# Patient Record
Sex: Male | Born: 1995
Health system: Southern US, Community
[De-identification: ages and names within clinical notes are randomized; demographics above are authoritative.]

---
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
## 2015-11-11 ENCOUNTER — Encounter: Payer: Self-pay | Admitting: Emergency Medicine

## 2015-11-11 ENCOUNTER — Emergency Department: Payer: 59

## 2015-11-11 ENCOUNTER — Emergency Department
Admission: EM | Admit: 2015-11-11 | Discharge: 2015-11-11 | Disposition: A | Discharge status: Home / Self Care | Payer: 59 | Attending: Emergency Medicine | Admitting: Emergency Medicine

## 2015-11-11 DIAGNOSIS — M25572 Pain in left ankle and joints of left foot: Secondary | ICD-10-CM | POA: Diagnosis present

## 2015-11-11 DIAGNOSIS — W500XXD Accidental hit or strike by another person, subsequent encounter: Secondary | ICD-10-CM | POA: Insufficient documentation

## 2015-11-11 DIAGNOSIS — Y998 Other external cause status: Secondary | ICD-10-CM | POA: Insufficient documentation

## 2015-11-11 DIAGNOSIS — Y929 Unspecified place or not applicable: Secondary | ICD-10-CM | POA: Insufficient documentation

## 2015-11-11 DIAGNOSIS — S82892A Other fracture of left lower leg, initial encounter for closed fracture: Secondary | ICD-10-CM | POA: Diagnosis not present

## 2015-11-11 DIAGNOSIS — Y9361 Activity, american tackle football: Secondary | ICD-10-CM | POA: Diagnosis not present

## 2015-11-11 DIAGNOSIS — F1290 Cannabis use, unspecified, uncomplicated: Secondary | ICD-10-CM | POA: Diagnosis not present

## 2015-11-11 MED ORDER — OXYCODONE-ACETAMINOPHEN 5-325 MG PO TABS
1.0000 | ORAL_TABLET | Freq: Once | ORAL | Status: AC
  Administered 2015-11-11: 1 via ORAL
  Filled 2015-11-11: qty 1

## 2015-11-11 MED ORDER — IBUPROFEN 600 MG PO TABS: 600.0000 mg | ORAL_TABLET | Freq: Four times a day (QID) | ORAL | Status: DC | PRN

## 2015-11-11 MED ORDER — IBUPROFEN 600 MG PO TABS
600.0000 mg | ORAL_TABLET | Freq: Once | ORAL | Status: AC
  Administered 2015-11-11: 600 mg via ORAL
  Filled 2015-11-11: qty 1

## 2015-11-11 MED ORDER — IBUPROFEN 50 MG PO CHEW: 50.0000 mg | CHEWABLE_TABLET | Freq: Three times a day (TID) | ORAL | Status: DC | PRN

## 2015-11-11 MED ORDER — TRAMADOL HCL 50 MG PO TABS: 50.0000 mg | ORAL_TABLET | Freq: Four times a day (QID) | ORAL | Status: DC | PRN

## 2015-11-11 NOTE — ED Notes (Signed)
See triage note   Someone fell on to left ankle while playing football  haivng increased pain to ankle at present and unable to bear full wt

## 2015-11-11 NOTE — ED Notes (Signed)
Pt reports playing football and and guy fell on his left ankle now with pain and swelling

## 2015-11-11 NOTE — ED Provider Notes (Signed)
[facility] Emergency Department Provider Note   ____________________________________________  Time seen: Approximately 5:15 PM  I have reviewed the triage vital signs and the nursing notes.   HISTORY  Chief Complaint Ankle Pain    HPI Allen White is a 20 y.o. male patient complaining of left ankle pain and edema which occurred while playing football today. Patient states someone fell on his left ankle. Patient states since the incident that he is unable to bear weight. Patient rates his pain discomfort as a 7/10. No palliative measures until arrival to triage which time I spent was applied.   History reviewed. No pertinent past medical history.  There are no active problems to display for this patient.   History reviewed. No pertinent past surgical history.  Current Outpatient Rx  Name  Route  Sig  Dispense  Refill  . ibuprofen (ADVIL,MOTRIN) 50 MG chewable tablet   Oral   Chew 1 tablet (50 mg total) by mouth every 8 (eight) hours as needed for fever.   24 tablet   2   . traMADol (ULTRAM) 50 MG tablet   Oral   Take 1 tablet (50 mg total) by mouth every 6 (six) hours as needed.   20 tablet   0     Allergies Review of patient's allergies indicates no known allergies.  No family history on file.  Social History Social History  Substance Use Topics  . Smoking status: Never Smoker   . Smokeless tobacco: Never Used  . Alcohol Use: Yes     Comment: occas    Review of Systems Constitutional: No fever/chills Eyes: No visual changes. ENT: No sore throat. Cardiovascular: Denies chest pain. Respiratory: Denies shortness of breath. Gastrointestinal: No abdominal pain.  No nausea, no vomiting.  No diarrhea.  No constipation. Genitourinary: Negative for dysuria. Musculoskeletal: Left ankle pain and edema  Skin: Negative for rash. Neurological: Negative for headaches, focal weakness or  numbness.    ____________________________________________   PHYSICAL EXAM:  VITAL SIGNS: ED Triage Vitals  Enc Vitals Group     BP 11/11/15 1640 132/73 mmHg     Pulse Rate 11/11/15 1640 69     Resp 11/11/15 1640 20     Temp 11/11/15 1640 98.3 F (36.8 C)     Temp Source 11/11/15 1640 Oral     SpO2 11/11/15 1640 98 %     Weight 11/11/15 1640 180 lb (81.647 kg)     Height 11/11/15 1640 6' (1.829 m)     Head Cir --      Peak Flow --      Pain Score 11/11/15 1642 7     Pain Loc --      Pain Edu? --      Excl. in GC? --     Constitutional: Alert and oriented. Well appearing and in no acute distress. Eyes: Conjunctivae are normal. PERRL. EOMI. Head: Atraumatic. Nose: No congestion/rhinnorhea. Mouth/Throat: Mucous membranes are moist.  Oropharynx non-erythematous. Neck: No stridor.  No cervical spine tenderness to palpation. Hematological/Lymphatic/Immunilogical: No cervical lymphadenopathy. Cardiovascular: Normal rate, regular rhythm. Grossly normal heart sounds.  Good peripheral circulation. Respiratory: Normal respiratory effort.  No retractions. Lungs CTAB. Gastrointestinal: Soft and nontender. No distention. No abdominal bruits. No CVA tenderness. Musculoskeletal: Obvious deformity to the left ankle. Obvious lateral malleolus edema.  Neurologic:  Normal speech and language. No gross focal neurologic deficits are appreciated. No gait instability. Skin:  Skin is warm, dry and intact. No rash noted. Psychiatric:  Mood and affect are normal. Speech and behavior are normal.  ____________________________________________   LABS (all labs ordered are listed, but only abnormal results are displayed)  Labs Reviewed - No data to display ____________________________________________  EKG   ____________________________________________  RADIOLOGY  X-rays reveal avulsion fracture off the lateral malleolus.   ____________________________________________   PROCEDURES  Procedure(s) performed: None  Procedures  Critical Care performed: No  ____________________________________________   INITIAL IMPRESSION / ASSESSMENT AND PLAN / ED COURSE  Pertinent labs & imaging results that were available during my care of the patient were reviewed by me and considered in my medical decision making (see chart for details).  Avulsion fracture left lateral ankle. Discussed x-ray finding with patient. Patient placed in a posterior OCL splint. Patient given crutches for ambulation. Patient given discharge Instructions. Patient given a prescription for tramadol and ibuprofen. Patient advised follow orthopedics no improvement in 3 days. ____________________________________________   FINAL CLINICAL IMPRESSION(S) / ED DIAGNOSES  Final diagnoses:  Avulsion fracture of left ankle, closed, initial encounter      NEW MEDICATIONS STARTED DURING THIS VISIT:  New Prescriptions   IBUPROFEN (ADVIL,MOTRIN) 50 MG CHEWABLE TABLET    Chew 1 tablet (50 mg total) by mouth every 8 (eight) hours as needed for fever.   TRAMADOL (ULTRAM) 50 MG TABLET    Take 1 tablet (50 mg total) by mouth every 6 (six) hours as needed.     Note:  This document was prepared using Dragon voice recognition software and may include unintentional dictation errors.    Joni Reining, PA-C 11/11/15 1727  Myrna Blazer, MD 11/11/15 [phone]

## 2015-11-11 NOTE — Discharge Instructions (Signed)
Advised to wear his splint ambulate with crutches for 3 days. Cast or Splint Care Casts and splints support injured limbs and keep bones from moving while they heal.  HOME CARE  Keep the cast or splint uncovered during the drying period.  A plaster cast can take 24 to 48 hours to dry.  A fiberglass cast will dry in less than 1 hour.  Do not rest the cast on anything harder than a pillow for 24 hours.  Do not put weight on your injured limb. Do not put pressure on the cast. Wait for your doctor's approval.  Keep the cast or splint dry.  Cover the cast or splint with a plastic bag during baths or wet weather.  If you have a cast over your chest and belly (trunk), take sponge baths until the cast is taken off.  If your cast gets wet, dry it with a towel or blow dryer. Use the cool setting on the blow dryer.  Keep your cast or splint clean. Wash a dirty cast with a damp cloth.  Do not put any objects under your cast or splint.  Do not scratch the skin under the cast with an object. If itching is a problem, use a blow dryer on a cool setting over the itchy area.  Do not trim or cut your cast.  Do not take out the padding from inside your cast.  Exercise your joints near the cast as told by your doctor.  Raise (elevate) your injured limb on 1 or 2 pillows for the first 1 to 3 days. GET HELP IF:  Your cast or splint cracks.  Your cast or splint is too tight or too loose.  You itch badly under the cast.  Your cast gets wet or has a soft spot.  You have a bad smell coming from the cast.  You get an object stuck under the cast.  Your skin around the cast becomes red or sore.  You have new or more pain after the cast is put on. GET HELP RIGHT AWAY IF:  You have fluid leaking through the cast.  You cannot move your fingers or toes.  Your fingers or toes turn blue or white or are cool, painful, or puffy (swollen).  You have tingling or lose feeling (numbness) around  the injured area.  You have bad pain or pressure under the cast.  You have trouble breathing or have shortness of breath.  You have chest pain.   This information is not intended to replace advice given to you by your health care provider. Make sure you discuss any questions you have with your health care provider.   Document Released: 08/14/2010 Document Revised: 12/15/2012 Document Reviewed: 10/21/2012 Elsevier Interactive Patient Education [employer]2016 [employer]

## 2015-12-05 LAB — HM HIV SCREENING LAB: HM HIV Screening: NEGATIVE

## 2017-02-03 IMAGING — DX DG ANKLE COMPLETE 3+V*L*
3 series · 3 of 3 positions shown · non-contrast
Comparison: None.

CLINICAL DATA: 20-year-old male with acute left ankle injury pain
and swelling following football injury today. Initial encounter.

EXAM:
LEFT ANKLE COMPLETE - 3+ VIEW

[ankle ap]
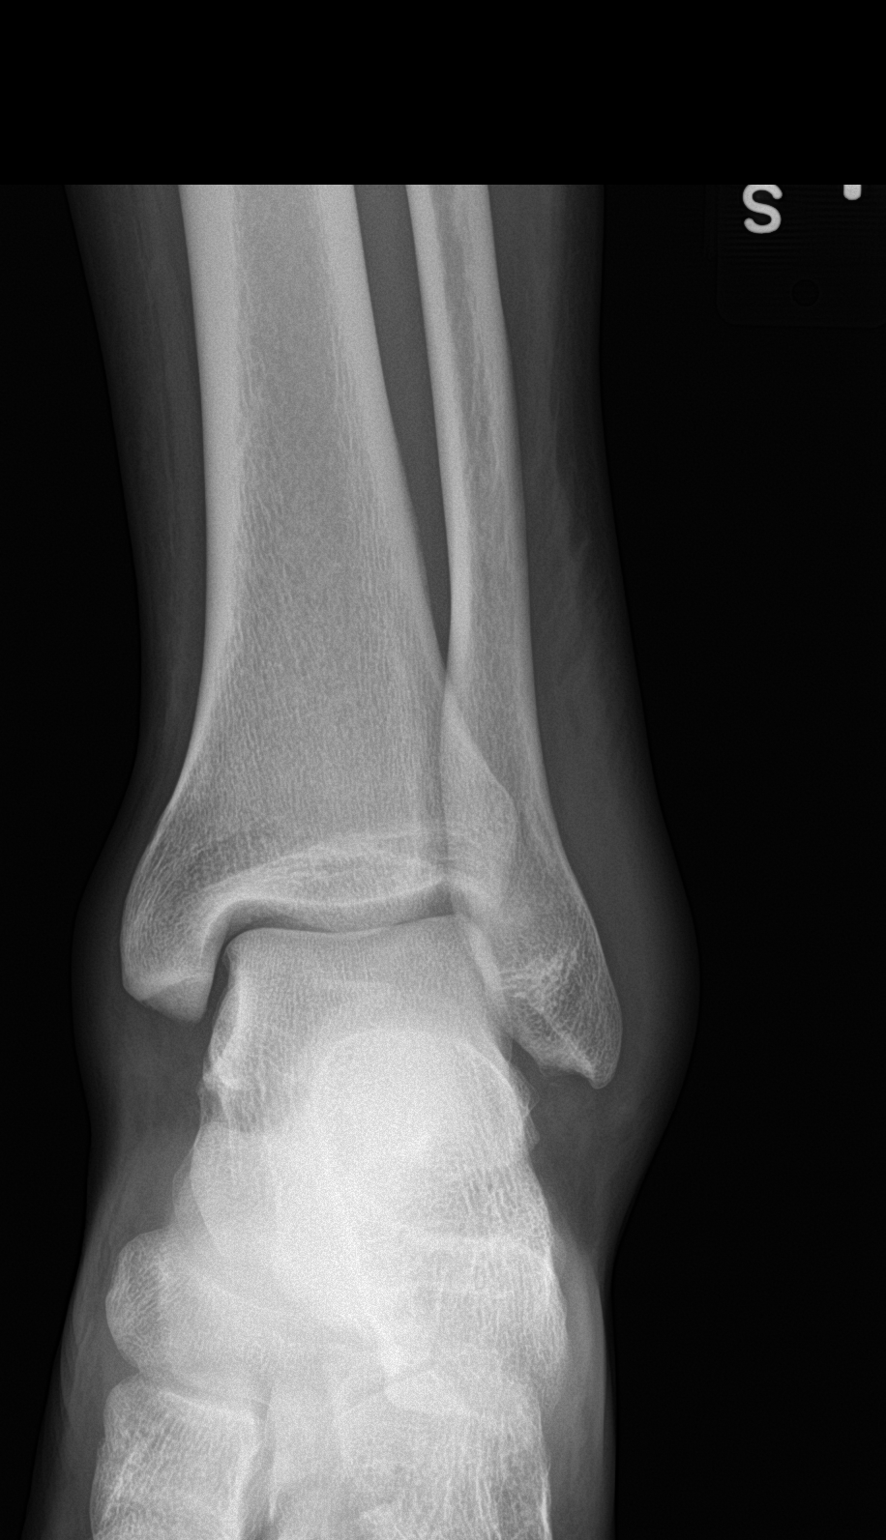

[ankle obl]
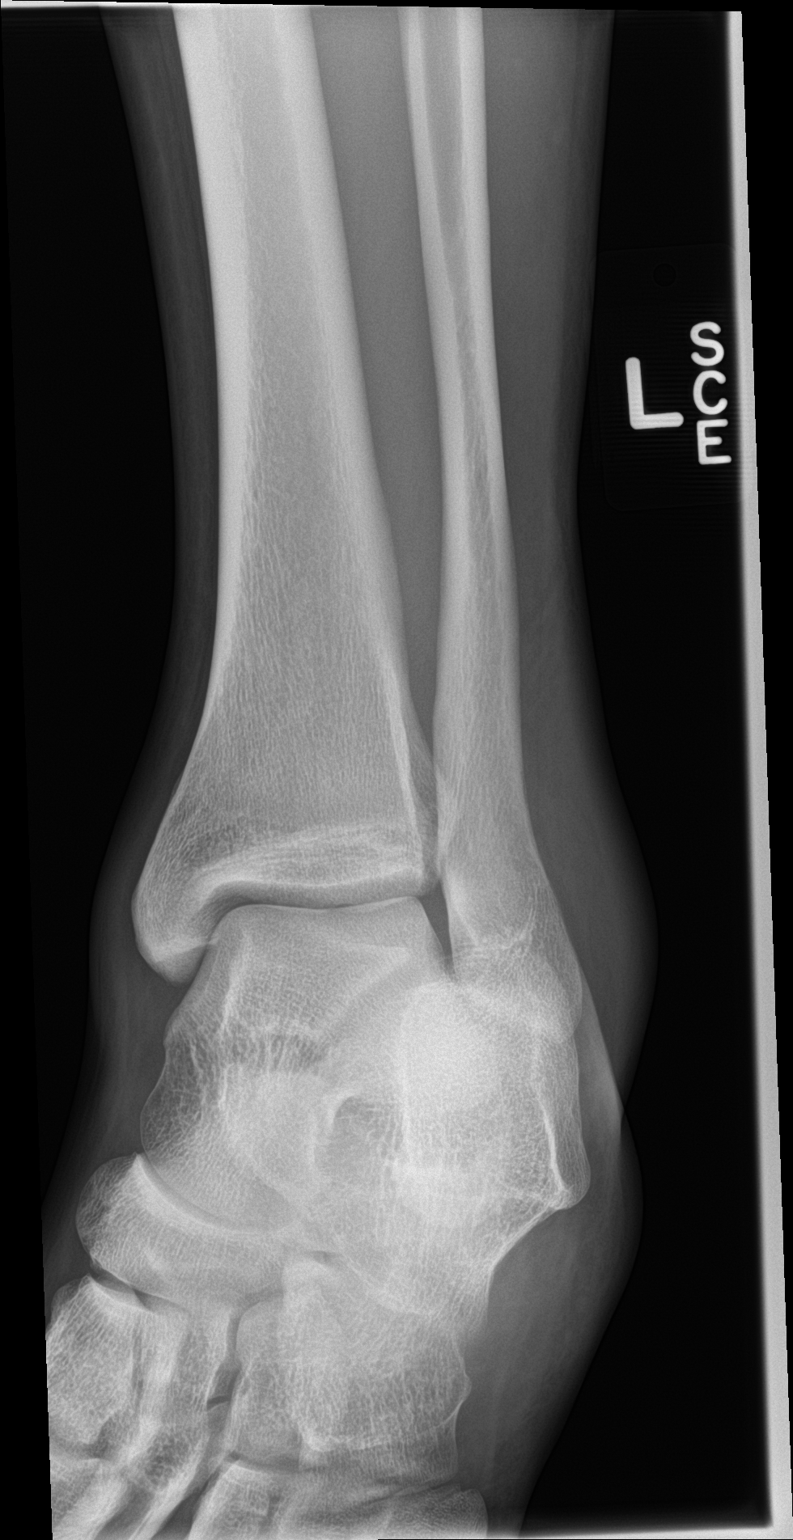

[ankle lat]
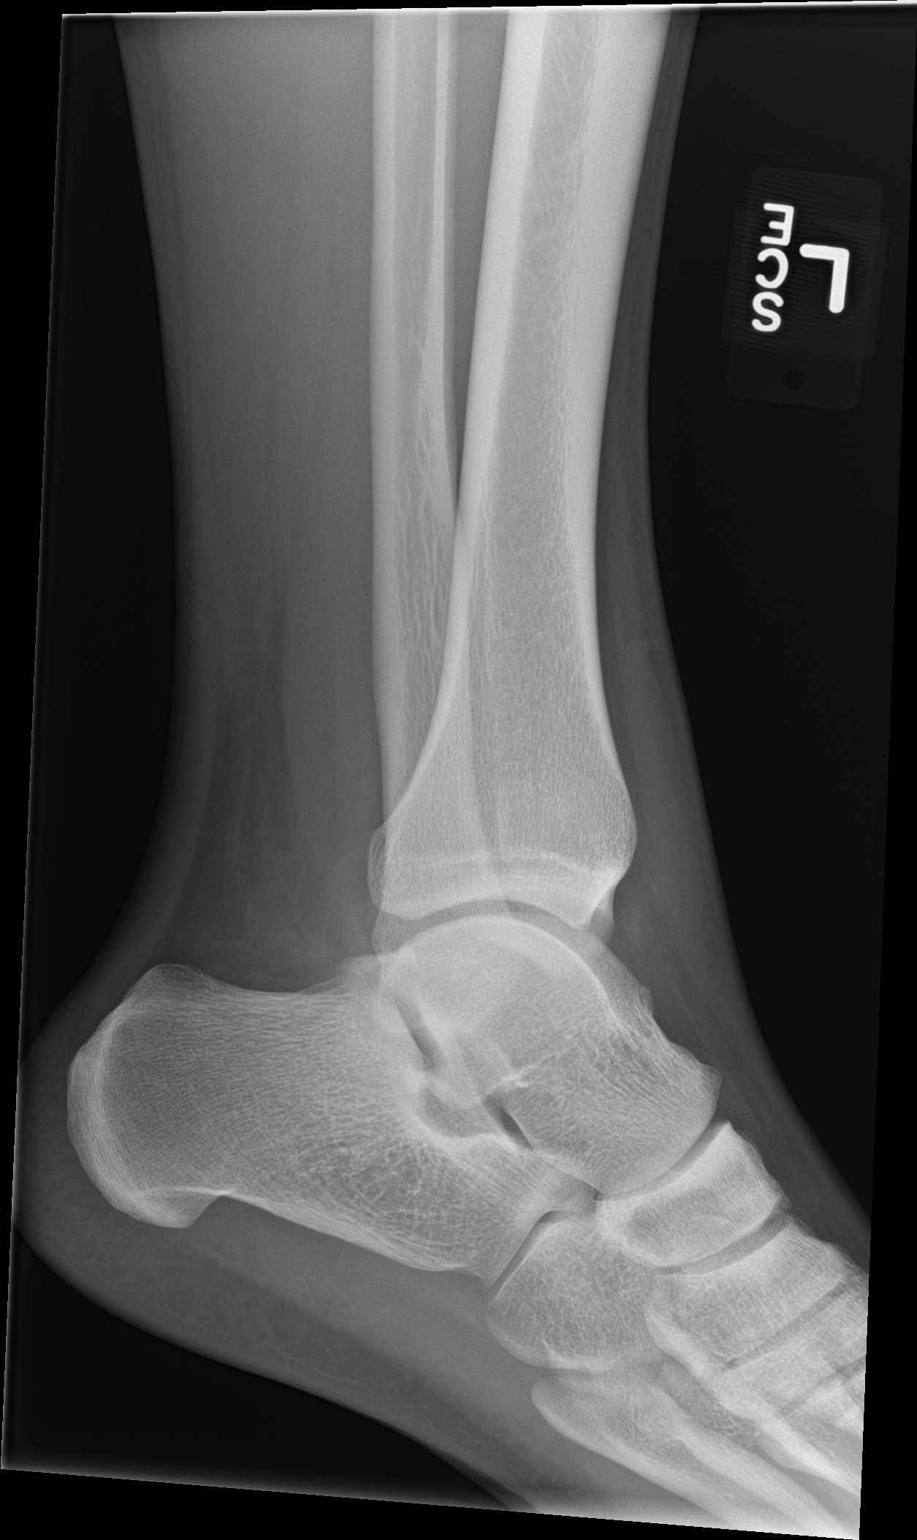

[3 of 3 positions shown; findings below may reference images not displayed]

FINDINGS: An equivocal tiny avulsion fracture off of the distal fibula noted.

Lateral soft tissue swelling is noted.

No other fracture, subluxation or dislocation identified.
IMPRESSION: Equivocal tiny avulsion fracture off the distal fibula. Lateral soft
tissue swelling.

## 2018-03-28 ENCOUNTER — Emergency Department (HOSPITAL_COMMUNITY)
Admission: EM | Admit: 2018-03-28 | Discharge: 2018-03-28 | Disposition: A | Discharge status: Home / Self Care | Payer: 59 | Attending: Emergency Medicine | Admitting: Emergency Medicine

## 2018-03-28 ENCOUNTER — Encounter (HOSPITAL_COMMUNITY): Payer: Self-pay | Admitting: Emergency Medicine

## 2018-03-28 ENCOUNTER — Other Ambulatory Visit: Payer: Self-pay

## 2018-03-28 DIAGNOSIS — F10920 Alcohol use, unspecified with intoxication, uncomplicated: Secondary | ICD-10-CM | POA: Insufficient documentation

## 2018-03-28 LAB — CBG MONITORING, ED: Glucose-Capillary: 101 mg/dL — ABNORMAL HIGH (ref 70–99)

## 2018-03-28 NOTE — ED Notes (Signed)
Patient had vomit all over shirt and upper body. Patient was cleaned up and tried to contact family and no answer.

## 2018-03-28 NOTE — ED Notes (Signed)
[facility] Expected date:  Expected time:  Means of arrival:  Comments: EMS [age]'s male intoxicated

## 2018-03-28 NOTE — ED Notes (Signed)
RN Elita Booneash makes multiple attempts to interact with pt and perform assessment. Pt after asking basic questions such as name is choosing not to speak with staff.

## 2018-03-28 NOTE — ED Provider Notes (Signed)
TIME SEEN: 2:14 AM  CHIEF COMPLAINT: Alcohol intoxication  HPI: Patient is a 22 year old male with no significant past medical history who presents to the emergency department with EMS for alcohol intoxication.  Patient refusing to speak to staff.  ROS: Level 5 caveat for intoxication  PAST MEDICAL HISTORY/PAST SURGICAL HISTORY:  History reviewed. No pertinent past medical history.  MEDICATIONS:  Prior to Admission medications   Medication Sig Start Date End Date Taking? Authorizing Provider  ibuprofen (ADVIL,MOTRIN) 600 MG tablet Take 1 tablet (600 mg total) by mouth every 6 (six) hours as needed. 11/11/15   Joni ReiningSmith, Ronald K, PA-C  traMADol (ULTRAM) 50 MG tablet Take 1 tablet (50 mg total) by mouth every 6 (six) hours as needed for moderate pain. 11/11/15   Joni ReiningSmith, Ronald K, PA-C    ALLERGIES:  No Known Allergies  SOCIAL HISTORY:  Social History   Tobacco Use  . Smoking status: Never Smoker  . Smokeless tobacco: Never Used  Substance Use Topics  . Alcohol use: Yes    Comment: occas    FAMILY HISTORY: History reviewed. No pertinent family history.  EXAM: BP 122/67 (BP Location: Right Arm)   Pulse 74   Temp (!) 97.4 F (36.3 C) (Axillary)   SpO2 98%  CONSTITUTIONAL: Patient sleeping.  Refuses to talk to staff.  Appears intoxicated and smells strongly of alcohol. HEAD: Normocephalic EYES: Conjunctivae clear, pupils appear equal, EOMI ENT: normal nose; moist mucous membranes NECK: Supple, no meningismus, no nuchal rigidity, no LAD  CARD: RRR; S1 and S2 appreciated; no murmurs, no clicks, no rubs, no gallops RESP: Normal chest excursion without splinting or tachypnea; breath sounds clear and equal bilaterally; no wheezes, no rhonchi, no rales, no hypoxia or respiratory distress, speaking full sentences ABD/GI: Normal bowel sounds; non-distended; soft, non-tender, no rebound, no guarding, no peritoneal signs, no hepatosplenomegaly BACK:  The back appears normal and is  non-tender to palpation, there is no CVA tenderness EXT: Normal ROM in all joints; non-tender to palpation; no edema; normal capillary refill; no cyanosis, no calf tenderness or swelling    SKIN: Normal color for age and race; warm; no rash NEURO: Moves all extremities equally PSYCH: The patient's mood and manner are appropriate. Grooming and personal hygiene are appropriate.  MEDICAL DECISION MAKING: Patient here with alcohol intoxication.  Hemodynamically stable.  Will check blood sugar and continue to monitor until clinically sober when he can be reassessed.  No signs of trauma on examination.  ED PROGRESS: Patient now awake.  No complaints of pain.  Normal blood sugar.  Able to ambulate.  Significant other and mother at bedside to take him home.  They state that he was intoxicated at a bar and his friends called EMS.  Family will stay with him at home.   At this time, I do not feel there is any life-threatening condition present. I have reviewed and discussed all results (EKG, imaging, lab, urine as appropriate) and exam findings with patient/family. I have reviewed nursing notes and appropriate previous records.  I feel the patient is safe to be discharged home without further emergent workup and can continue workup as an outpatient as needed. Discussed usual and customary return precautions. Patient/family verbalize understanding and are comfortable with this plan.  Outpatient follow-up has been provided as needed. All questions have been answered.      , Layla MawKristen N, DO 03/28/18 [phone]

## 2018-03-28 NOTE — ED Notes (Signed)
Mom called patients phone and nurse spoke to her. The only info given was that patient was here and doing ok. Patient mom is on her way.

## 2018-03-28 NOTE — ED Triage Notes (Signed)
Patient was at a bar with friend. Patient was left by friends at bar passed out. Patient vomited when EMS picked him up. Patient was given an IV and 4mg  of zofran. EMS does not know how much he drank.

## 2019-11-10 ENCOUNTER — Other Ambulatory Visit: Payer: Self-pay

## 2019-11-10 ENCOUNTER — Ambulatory Visit
Admission: EM | Admit: 2019-11-10 | Discharge: 2019-11-10 | Disposition: A | Discharge status: Home / Self Care | Payer: BC Managed Care – PPO

## 2019-11-10 DIAGNOSIS — R030 Elevated blood-pressure reading, without diagnosis of hypertension: Secondary | ICD-10-CM | POA: Diagnosis not present

## 2019-11-10 NOTE — ED Provider Notes (Signed)
[facility]    CSN: [id number] Arrival date & time: 11/10/19  1313      History   Chief Complaint Chief Complaint  Patient presents with  . Hypertension    HPI Allen White is a 24 y.o. male.   24 year old male comes in for elevated BP readings. States has been going to the dentist the past few months, with high readings, unsure number. Denies chest pain, shortness of breath, one sided weakness. Has noticed intermittent hand swelling without obvious cause. Denies current swelling. Unsure of family history of HTN.      History reviewed. No pertinent past medical history.  There are no problems to display for this patient.   History reviewed. No pertinent surgical history.     Home Medications    Prior to Admission medications   Not on File    Family History History reviewed. No pertinent family history.  Social History Social History   Tobacco Use  . Smoking status: Never Smoker  . Smokeless tobacco: Never Used  Substance Use Topics  . Alcohol use: Yes    Comment: occas  . Drug use: Yes    Types: Marijuana     Allergies   Patient has no known allergies.   Review of Systems Review of Systems  Reason unable to perform ROS: See HPI as above.     Physical Exam Triage Vital Signs ED Triage Vitals [11/10/19 1320]  Enc Vitals Group     BP (!) 142/76     Pulse Rate 65     Resp 18     Temp 98.2 F (36.8 C)     Temp src      SpO2 98 %     Weight      Height      Head Circumference      Peak Flow      Pain Score 0     Pain Loc      Pain Edu?      Excl. in GC?    No data found.  Updated Vital Signs BP (!) 142/76   Pulse 65   Temp 98.2 F (36.8 C)   Resp 18   SpO2 98%   Physical Exam Constitutional:      General: He is not in acute distress.    Appearance: Normal appearance. He is well-developed. He is not toxic-appearing or diaphoretic.  HENT:     Head: Normocephalic and atraumatic.  Eyes:     Conjunctiva/sclera:  Conjunctivae normal.     Pupils: Pupils are equal, round, and reactive to light.  Cardiovascular:     Rate and Rhythm: Normal rate and regular rhythm.  Pulmonary:     Effort: Pulmonary effort is normal. No respiratory distress.     Comments: LCTAB Musculoskeletal:     Cervical back: Normal range of motion and neck supple.  Skin:    General: Skin is warm and dry.  Neurological:     Mental Status: He is alert and oriented to person, place, and time.    UC Treatments / Results  Labs (all labs ordered are listed, but only abnormal results are displayed) Labs Reviewed - No data to display  EKG   Radiology No results found.  Procedures Procedures (including critical care time)  Medications Ordered in UC Medications - No data to display  Initial Impression / Assessment and Plan / UC Course  I have reviewed the triage vital signs and the nursing notes.  Pertinent  labs & imaging results that were available during my care of the patient were reviewed by me and considered in my medical decision making (see chart for details).    Discussed diet changes. Push fluids. Monitor BP at home and keep log. Follow up with PCP as scheduled for further evaluation and monitoring. Return precautions given.  Final Clinical Impressions(s) / UC Diagnoses   Final diagnoses:  Elevated blood pressure reading    ED Prescriptions    None     PDMP not reviewed this encounter.   Belinda Fisher, PA-C 11/10/19 1343

## 2019-11-10 NOTE — ED Triage Notes (Signed)
Pt c/o high BP reading at the dentist 3+ months ago. Pt c/o intermittent hand swelling x 3 months, hands normal at this time per patient.

## 2019-11-10 NOTE — Discharge Instructions (Signed)
Decrease salt in your diet, increase water intake. Cut out caffeine, alcohol as much as possible. Keep log of blood pressure readings at home. Follow up with PCP as scheduled for monitoring and evaluation.

## 2019-11-14 ENCOUNTER — Ambulatory Visit: Payer: Self-pay

## 2019-11-18 ENCOUNTER — Ambulatory Visit: Payer: Self-pay | Admitting: Physician Assistant

## 2019-11-18 ENCOUNTER — Other Ambulatory Visit: Payer: Self-pay

## 2019-11-18 DIAGNOSIS — Z202 Contact with and (suspected) exposure to infections with a predominantly sexual mode of transmission: Secondary | ICD-10-CM

## 2019-11-18 DIAGNOSIS — Z113 Encounter for screening for infections with a predominantly sexual mode of transmission: Secondary | ICD-10-CM

## 2019-11-18 MED ORDER — AZITHROMYCIN 500 MG PO TABS: 1000.0000 mg | ORAL_TABLET | Freq: Once | ORAL | Status: DC

## 2019-11-18 NOTE — Progress Notes (Addendum)
Gram stain reviewed, no treatment indicated. Treatment offered for NGU per provider orders, but patient declined treatment due to negative gram stain. He states that since his test was negative he doesn't want to take medication.Burt Knack, RN

## 2019-11-19 ENCOUNTER — Encounter: Payer: Self-pay | Admitting: Physician Assistant

## 2019-11-19 LAB — GRAM STAIN

## 2019-11-19 NOTE — Progress Notes (Signed)
[facility] Department STI clinic/screening visit  Subjective:  Allen White is a 24 y.o. male being seen today for an STI screening visit. The patient reports they do not have symptoms.    Patient has the following medical conditions:  There are no problems to display for this patient.    Chief Complaint  Patient presents with  . SEXUALLY TRANSMITTED DISEASE    screening    HPI  Patient reports that he is not having any symptoms but a former partner told him that she tested positive for Chlamydia.  Denies chronic conditions, surgeries and regular medicines.  States last HIV testing was 1.5 years ago.  Last void prior to sample collection for Gram stain was 2 hr ago.   See flowsheet for further details and programmatic requirements.    The following portions of the patient's history were reviewed and updated as appropriate: allergies, current medications, past medical history, past social history, past surgical history and problem list.  Objective:  There were no vitals filed for this visit.  Physical Exam Constitutional:      General: He is not in acute distress.    Appearance: Normal appearance.  HENT:     Head: Normocephalic and atraumatic.     Comments: No nits, lice or hair loss. No cervical, supraclavicular or axillary adenopathy.    Mouth/Throat:     Mouth: Mucous membranes are moist.     Pharynx: Oropharynx is clear. No oropharyngeal exudate or posterior oropharyngeal erythema.  Eyes:     Conjunctiva/sclera: Conjunctivae normal.  Pulmonary:     Effort: Pulmonary effort is normal.  Abdominal:     Palpations: Abdomen is soft. There is no mass.     Tenderness: There is no abdominal tenderness. There is no guarding or rebound.  Genitourinary:    Penis: Normal.      Testes: Normal.     Comments: Pubic area without nits, lice, edema, erythema, lesions and inguinal adenopathy. Penis circumcised, without rash, lesions and discharge form  meatus. Musculoskeletal:     Cervical back: Neck supple. No tenderness.  Skin:    General: Skin is warm and dry.     Findings: No bruising, erythema, lesion or rash.  Neurological:     Mental Status: He is alert and oriented to person, place, and time.  Psychiatric:        Mood and Affect: Mood normal.        Behavior: Behavior normal.        Thought Content: Thought content normal.        Judgment: Judgment normal.       Assessment and Plan:  Allen White is a 24 y.o. male presenting to the [facility] Department for STI screening  1. Screening for STD (sexually transmitted disease) Patient into clinic without symptoms. Patient declines blood work today.  Rec condoms with all sex. Await test results.  Counseled that RN will call if needs to RTC for treatment once results are back. - Gram stain - Gonococcus culture  2. Chlamydia contact Counseled patient that since a partner tested positive for an infection, Chlamydia,we recommend that he be treated. Counseled that the Gram stain is not a specific test for Chlamydia and even if negative he could still have the Chlamydia infection. Offer Azithromycin 1 g po DOT to treat as a contact to Chlamydia today. Rec that patient's new partner also be screened.     No follow-ups on file.  Future Appointments  Date Time Provider Department Center  01/13/2020  9:30 AM Mayer Masker, PA-C [facility] None    Matt Holmes, [place]

## 2019-11-23 LAB — GONOCOCCUS CULTURE

## 2019-12-23 ENCOUNTER — Other Ambulatory Visit: Payer: Self-pay | Admitting: Sleep Medicine

## 2019-12-23 ENCOUNTER — Other Ambulatory Visit: Payer: BC Managed Care – PPO

## 2019-12-23 DIAGNOSIS — Z20822 Contact with and (suspected) exposure to covid-19: Secondary | ICD-10-CM | POA: Diagnosis not present

## 2019-12-25 LAB — SARS-COV-2, NAA 2 DAY TAT

## 2019-12-25 LAB — NOVEL CORONAVIRUS, NAA: SARS-CoV-2, NAA: NOT DETECTED

## 2019-12-30 ENCOUNTER — Other Ambulatory Visit: Payer: Self-pay

## 2019-12-30 ENCOUNTER — Ambulatory Visit
Admission: EM | Admit: 2019-12-30 | Discharge: 2019-12-30 | Disposition: A | Discharge status: Home / Self Care | Payer: BC Managed Care – PPO | Attending: Physician Assistant | Admitting: Physician Assistant

## 2019-12-30 DIAGNOSIS — Z1152 Encounter for screening for COVID-19: Secondary | ICD-10-CM | POA: Diagnosis not present

## 2019-12-30 DIAGNOSIS — R059 Cough, unspecified: Secondary | ICD-10-CM

## 2019-12-30 DIAGNOSIS — R509 Fever, unspecified: Secondary | ICD-10-CM

## 2019-12-30 DIAGNOSIS — R0981 Nasal congestion: Secondary | ICD-10-CM

## 2019-12-30 MED ORDER — BENZONATATE 200 MG PO CAPS: 200.0000 mg | ORAL_CAPSULE | Freq: Three times a day (TID) | ORAL | 0 refills | Status: DC

## 2019-12-30 NOTE — Discharge Instructions (Addendum)
COVID PCR testing ordered. I would like you to quarantine until testing results. Tessalon for cough. Start nasonex nasal spray for nasal congestion/drainage. You can use over the counter nasal saline rinse such as neti pot for nasal congestion. Keep hydrated, your urine should be clear to pale yellow in color. Tylenol/motrin for fever and pain. Keep hydrated, urine should be clear to pale yellow in color. If experiencing shortness of breath, trouble breathing, go to the emergency department for further evaluation needed.

## 2019-12-30 NOTE — ED Triage Notes (Signed)
Pt c/o malaise, HA, productive cough with clear/green sputum, congestion, runny nose, lack of appetite, body aches, decreased sense of taste/smell since Tuesday. Reports Tmax of 101.5 last night which improved with ibuprofen. States his mom had similar symptoms and was negative for COVID. Denies sore throat, v/d, SOB.

## 2019-12-30 NOTE — ED Provider Notes (Signed)
[facility]    CSN: [id number] Arrival date & time: 12/30/19  0847      History   Chief Complaint Chief Complaint  Patient presents with   Fever   Fatigue    HPI Allen White is a 24 y.o. male.   24 year old male comes in for 3 day of URI symptoms. Cough, headache, nasal congestion, rhinorrhea, sinus pressure, lack of appetite, body aches, decreased sense of taste/smell. Had a temp of 101.5 last night, improved with antipyretic, no antipyretic in last 8 hours. Denies abdominal pain, nausea, vomiting, diarrhea. Denies shortness of breath. Current every day smoker, 2 cigars. Positive sick contact.     History reviewed. No pertinent past medical history.  There are no problems to display for this patient.   History reviewed. No pertinent surgical history.     Home Medications    Prior to Admission medications   Medication Sig Start Date End Date Taking? Authorizing Provider  benzonatate (TESSALON) 200 MG capsule Take 1 capsule (200 mg total) by mouth every 8 (eight) hours. 12/30/19   Belinda Fisher, PA-C    Family History Family History  Problem Relation Age of Onset   Healthy Mother    Healthy Father     Social History Social History   Tobacco Use   Smoking status: Current Some Day Smoker    Types: Cigars   Smokeless tobacco: Never Used  Building services engineer Use: Never used  Substance Use Topics   Alcohol use: Yes    Comment: occas   Drug use: Yes    Types: Marijuana     Allergies   Patient has no known allergies.   Review of Systems Review of Systems  Reason unable to perform ROS: See HPI as above.     Physical Exam Triage Vital Signs ED Triage Vitals  Enc Vitals Group     BP 12/30/19 1018 124/87     Pulse Rate 12/30/19 1018 85     Resp 12/30/19 1018 16     Temp 12/30/19 1018 98.4 F (36.9 C)     Temp Source 12/30/19 1018 Oral     SpO2 12/30/19 1018 97 %     Weight --      Height --      Head Circumference --       Peak Flow --      Pain Score 12/30/19 1028 0     Pain Loc --      Pain Edu? --      Excl. in GC? --    No data found.  Updated Vital Signs BP 124/87 (BP Location: Left Arm)    Pulse 85    Temp 98.4 F (36.9 C) (Oral)    Resp 16    SpO2 97%   Physical Exam Constitutional:      General: He is not in acute distress.    Appearance: He is well-developed. He is not ill-appearing, toxic-appearing or diaphoretic.  HENT:     Head: Normocephalic and atraumatic.     Left Ear: Tympanic membrane, ear canal and external ear normal. Tympanic membrane is not erythematous or bulging.     Ears:     Comments: Right ear cerumen impaction.     Nose:     Right Sinus: No maxillary sinus tenderness or frontal sinus tenderness.     Left Sinus: No maxillary sinus tenderness or frontal sinus tenderness.     Mouth/Throat:  Mouth: Mucous membranes are moist.     Pharynx: Oropharynx is clear. Uvula midline.  Eyes:     Conjunctiva/sclera: Conjunctivae normal.     Pupils: Pupils are equal, round, and reactive to light.  Cardiovascular:     Rate and Rhythm: Normal rate and regular rhythm.  Pulmonary:     Effort: Pulmonary effort is normal. No accessory muscle usage, prolonged expiration, respiratory distress or retractions.     Breath sounds: No decreased air movement or transmitted upper airway sounds. No decreased breath sounds.     Comments: LCTAB Musculoskeletal:     Cervical back: Normal range of motion and neck supple.  Skin:    General: Skin is warm and dry.  Neurological:     Mental Status: He is alert and oriented to person, place, and time.      UC Treatments / Results  Labs (all labs ordered are listed, but only abnormal results are displayed) Labs Reviewed  NOVEL CORONAVIRUS, NAA    EKG   Radiology No results found.  Procedures Procedures (including critical care time)  Medications Ordered in UC Medications - No data to display  Initial Impression / Assessment and Plan  / UC Course  I have reviewed the triage vital signs and the nursing notes.  Pertinent labs & imaging results that were available during my care of the patient were reviewed by me and considered in my medical decision making (see chart for details).    COVID PCR test ordered. Patient to quarantine until testing results return. No alarming signs on exam. LCTAB. Symptomatic treatment discussed.  Push fluids.  Return precautions given.  Patient expresses understanding and agrees to plan.  Final Clinical Impressions(s) / UC Diagnoses   Final diagnoses:  Encounter for screening for COVID-19  Fever, unspecified  Cough  Nasal congestion    ED Prescriptions    Medication Sig Dispense Auth. Provider   benzonatate (TESSALON) 200 MG capsule Take 1 capsule (200 mg total) by mouth every 8 (eight) hours. 21 capsule Belinda Fisher, PA-C     PDMP not reviewed this encounter.   Belinda Fisher, PA-C 12/30/19 1051

## 2019-12-31 LAB — NOVEL CORONAVIRUS, NAA: SARS-CoV-2, NAA: DETECTED — AB

## 2020-01-02 ENCOUNTER — Telehealth: Payer: Self-pay | Admitting: Nurse Practitioner

## 2020-01-02 NOTE — Telephone Encounter (Signed)
Called to discuss with Allen White about Covid symptoms and the use of casirivimab/imdevimab, a combination monoclonal antibody infusion for those with mild to moderate Covid symptoms and at a high risk of hospitalization.     Pt is qualified for this infusion at the [facility] infusion center due to co-morbid conditions and/or a member of an at-risk group. Unable to reach. Voicemail left and Mychart message sent.   Willette Alma, AGPCNP-BC

## 2020-01-13 ENCOUNTER — Ambulatory Visit: Payer: BC Managed Care – PPO | Admitting: Physician Assistant

## 2020-01-24 ENCOUNTER — Encounter: Payer: Self-pay | Admitting: Physician Assistant

## 2020-01-24 ENCOUNTER — Other Ambulatory Visit: Payer: Self-pay

## 2020-01-24 ENCOUNTER — Ambulatory Visit (INDEPENDENT_AMBULATORY_CARE_PROVIDER_SITE_OTHER): Payer: BC Managed Care – PPO | Admitting: Physician Assistant

## 2020-01-24 VITALS — BP 122/83 | HR 71 | Ht 70.87 in | Wt 205.2 lb

## 2020-01-24 DIAGNOSIS — Z7689 Persons encountering health services in other specified circumstances: Secondary | ICD-10-CM

## 2020-01-24 DIAGNOSIS — G4730 Sleep apnea, unspecified: Secondary | ICD-10-CM | POA: Diagnosis not present

## 2020-01-24 DIAGNOSIS — Z8249 Family history of ischemic heart disease and other diseases of the circulatory system: Secondary | ICD-10-CM

## 2020-01-24 DIAGNOSIS — F515 Nightmare disorder: Secondary | ICD-10-CM

## 2020-01-24 DIAGNOSIS — Z8616 Personal history of COVID-19: Secondary | ICD-10-CM

## 2020-01-24 NOTE — Patient Instructions (Signed)

## 2020-01-24 NOTE — Progress Notes (Signed)
New Patient Office Visit  Subjective:  Patient ID: Allen White, male    DOB: November 21, 1995  Age: 24 y.o. MRN: [id number]  CC:  Chief Complaint  Patient presents with  . New Patient (Initial Visit)    HPI Allen White presents to establish as a new patient with concerns of elevated blood pressure and breathing post Covid infection. Patient reports he continues to experience some shortness of breath with exertion. His mom has an albuterol inhaler which he has used in a few instances for severe shortness of breath. Also reports random palpitations that last briefly which started after getting sick with Covid. Reports a family history of hypertension and there have been some instances where his blood pressure has been elevated. He does not check his blood pressure at home. Denies chest pain, dizziness, or syncope. States he is getting his smell back and there are certain smells that trigger a headache.  Also reports instances where he stops breathing during his sleep and wakes up due to a choking sensation, which has been ongoing for several months.  Also reports nightmares. Denies snoring.  History reviewed. No pertinent past medical history.  History reviewed. No pertinent surgical history.  Family History  Problem Relation Age of Onset  . Healthy Mother   . Healthy Father     Social History   Socioeconomic History  . Marital status: Single    Spouse name: Not on file  . Number of children: Not on file  . Years of education: Not on file  . Highest education level: Not on file  Occupational History  . Not on file  Tobacco Use  . Smoking status: Never Smoker  . Smokeless tobacco: Never Used  Vaping Use  . Vaping Use: Never used  Substance and Sexual Activity  . Alcohol use: Yes    Alcohol/week: 2.0 standard drinks    Types: 2 Cans of beer per week  . Drug use: Yes    Types: Marijuana  . Sexual activity: Yes    Partners: Female    Birth control/protection: None  Other  Topics Concern  . Not on file  Social History Narrative  . Not on file   Social Determinants of Health   Financial Resource Strain:   . Difficulty of Paying Living Expenses: Not on file  Food Insecurity:   . Worried About Programme researcher, broadcasting/film/video in the Last Year: Not on file  . Ran Out of Food in the Last Year: Not on file  Transportation Needs:   . Lack of Transportation (Medical): Not on file  . Lack of Transportation (Non-Medical): Not on file  Physical Activity:   . Days of Exercise per Week: Not on file  . Minutes of Exercise per Session: Not on file  Stress:   . Feeling of Stress : Not on file  Social Connections:   . Frequency of Communication with Friends and Family: Not on file  . Frequency of Social Gatherings with Friends and Family: Not on file  . Attends Religious Services: Not on file  . Active Member of Clubs or Organizations: Not on file  . Attends Banker Meetings: Not on file  . Marital Status: Not on file  Intimate Partner Violence:   . Fear of Current or Ex-Partner: Not on file  . Emotionally Abused: Not on file  . Physically Abused: Not on file  . Sexually Abused: Not on file    ROS Review of Systems A fourteen system review  of systems was performed and found to be positive as per HPI.  Objective:   Today's Vitals: BP 122/83   Pulse 71   Ht 5' 10.87" (1.8 m)   Wt 205 lb 3.2 oz (93.1 kg)   SpO2 97%   BMI 28.73 kg/m   Physical Exam  General:  Well Developed, well nourished, appropriate for stated age.  Neuro:  Alert and oriented,  extra-ocular muscles intact  HEENT:  Normocephalic, atraumatic, neck supple, no carotid bruits appreciated  Skin:  no gross rash, warm, pink. Cardiac:  RRR, S1 S2 Respiratory:  ECTA B/L and A/P, Not using accessory muscles, speaking in full sentences- unlabored. Vascular:  Ext warm, no cyanosis apprec.; cap RF less 2 sec. Psych:  No HI/SI, judgement and insight good, Euthymic mood. Full  Affect.  Assessment & Plan:   Problem List Items Addressed This Visit    None    Visit Diagnoses    Encounter to establish care    -  Primary   Sleep apnea, unspecified type       Relevant Orders   Ambulatory referral to Sleep Studies   History of COVID-19       Family history of hypertension       Nightmares       Relevant Orders   Ambulatory referral to Sleep Studies     History of COVID-19: -Discussed with patient post Covid symptoms and recovery. Recommend to increase physical activity gradually as tolerated. -Patient does not feel he needs a prescription for albuterol at this time. -Reassurance provided vital signs stable today.  Sleep apnea, Nightmares: -Discussed with patient referral to sleep center for further evaluation.  Patient is agreeable.  Family history of hypertension:  -BP today within normal limits. -Recommend to monitor BP and pulse at home few times per week, and keep a log. -Advised to schedule CPE and FBW   Outpatient Encounter Medications as of 01/24/2020  Medication Sig  . benzonatate (TESSALON) 200 MG capsule Take 1 capsule (200 mg total) by mouth every 8 (eight) hours. (Patient not taking: Reported on 01/24/2020)   No facility-administered encounter medications on file as of 01/24/2020.    Follow-up: Return in about 8 weeks (around 03/20/2020) for CPE with FBW few days before.   Note:  This note was prepared with assistance of Dragon voice recognition software. Occasional wrong-word or sound-a-like substitutions may have occurred due to the inherent limitations of voice recognition software.  Mayer Masker, PA-C

## 2020-03-13 ENCOUNTER — Other Ambulatory Visit: Payer: Self-pay

## 2020-03-13 DIAGNOSIS — Z0000 Encounter for general adult medical examination without abnormal findings: Secondary | ICD-10-CM

## 2020-03-15 ENCOUNTER — Other Ambulatory Visit: Payer: BC Managed Care – PPO

## 2020-03-15 ENCOUNTER — Other Ambulatory Visit: Payer: Self-pay

## 2020-03-15 DIAGNOSIS — Z0000 Encounter for general adult medical examination without abnormal findings: Secondary | ICD-10-CM | POA: Diagnosis not present

## 2020-03-16 LAB — LIPID PANEL
Chol/HDL Ratio: 3.9 ratio (ref 0.0–5.0)
Cholesterol, Total: 177 mg/dL (ref 100–199)
HDL: 45 mg/dL (ref >39)
LDL Chol Calc (NIH): 109 mg/dL — ABNORMAL HIGH (ref 0–99)
Triglycerides: 126 mg/dL (ref 0–149)
VLDL Cholesterol Cal: 23 mg/dL (ref 5–40)

## 2020-03-16 LAB — HEMOGLOBIN A1C
Est. average glucose Bld gHb Est-mCnc: 97 mg/dL
Hgb A1c MFr Bld: 5 % (ref 4.8–5.6)

## 2020-03-16 LAB — COMPREHENSIVE METABOLIC PANEL
ALT: 20 IU/L (ref 0–44)
AST: 15 IU/L (ref 0–40)
Albumin/Globulin Ratio: 2.2 (ref 1.2–2.2)
Albumin: 4.7 g/dL (ref 4.1–5.2)
Alkaline Phosphatase: 67 IU/L (ref 44–121)
BUN/Creatinine Ratio: 12 (ref 9–20)
BUN: 12 mg/dL (ref 6–20)
Bilirubin Total: 0.6 mg/dL (ref 0.0–1.2)
CO2: 26 mmol/L (ref 20–29)
Calcium: 9.5 mg/dL (ref 8.7–10.2)
Chloride: 102 mmol/L (ref 96–106)
Creatinine, Ser: 0.99 mg/dL (ref 0.76–1.27)
GFR calc Af Amer: 123 mL/min/{1.73_m2} (ref >59)
GFR calc non Af Amer: 106 mL/min/{1.73_m2} (ref >59)
Globulin, Total: 2.1 g/dL (ref 1.5–4.5)
Glucose: 93 mg/dL (ref 65–99)
Potassium: 4.2 mmol/L (ref 3.5–5.2)
Sodium: 141 mmol/L (ref 134–144)
Total Protein: 6.8 g/dL (ref 6.0–8.5)

## 2020-03-16 LAB — CBC WITH DIFFERENTIAL/PLATELET
Basophils Absolute: 0 10*3/uL (ref 0.0–0.2)
Basos: 0 %
EOS (ABSOLUTE): 0.3 10*3/uL (ref 0.0–0.4)
Eos: 4 %
Hematocrit: 47.7 % (ref 37.5–51.0)
Hemoglobin: 16.2 g/dL (ref 13.0–17.7)
Immature Grans (Abs): 0 10*3/uL (ref 0.0–0.1)
Immature Granulocytes: 0 %
Lymphocytes Absolute: 3 10*3/uL (ref 0.7–3.1)
Lymphs: 33 %
MCH: 31.2 pg (ref 26.6–33.0)
MCHC: 34 g/dL (ref 31.5–35.7)
MCV: 92 fL (ref 79–97)
Monocytes Absolute: 1 10*3/uL — ABNORMAL HIGH (ref 0.1–0.9)
Monocytes: 11 %
Neutrophils Absolute: 4.8 10*3/uL (ref 1.4–7.0)
Neutrophils: 52 %
Platelets: 256 10*3/uL (ref 150–450)
RBC: 5.2 x10E6/uL (ref 4.14–5.80)
RDW: 12.2 % (ref 11.6–15.4)
WBC: 9.2 10*3/uL (ref 3.4–10.8)

## 2020-03-16 LAB — TSH: TSH: 0.779 u[IU]/mL (ref 0.450–4.500)

## 2020-03-20 ENCOUNTER — Encounter: Payer: BC Managed Care – PPO | Admitting: Physician Assistant

## 2020-03-24 ENCOUNTER — Other Ambulatory Visit: Payer: BC Managed Care – PPO

## 2020-03-24 DIAGNOSIS — I471 Supraventricular tachycardia: Secondary | ICD-10-CM

## 2020-03-25 LAB — SARS-COV-2, NAA 2 DAY TAT

## 2020-03-25 LAB — NOVEL CORONAVIRUS, NAA: SARS-CoV-2, NAA: NOT DETECTED

## 2020-04-05 ENCOUNTER — Encounter: Payer: Self-pay | Admitting: Physician Assistant

## 2020-04-05 ENCOUNTER — Other Ambulatory Visit: Payer: Self-pay

## 2020-04-05 ENCOUNTER — Ambulatory Visit (INDEPENDENT_AMBULATORY_CARE_PROVIDER_SITE_OTHER): Payer: BC Managed Care – PPO | Admitting: Physician Assistant

## 2020-04-05 VITALS — BP 97/61 | HR 74 | Temp 98.7°F | Ht 70.75 in | Wt 206.0 lb

## 2020-04-05 DIAGNOSIS — Z0000 Encounter for general adult medical examination without abnormal findings: Secondary | ICD-10-CM | POA: Diagnosis not present

## 2020-04-05 DIAGNOSIS — H6121 Impacted cerumen, right ear: Secondary | ICD-10-CM

## 2020-04-05 DIAGNOSIS — Z113 Encounter for screening for infections with a predominantly sexual mode of transmission: Secondary | ICD-10-CM

## 2020-04-05 DIAGNOSIS — Z23 Encounter for immunization: Secondary | ICD-10-CM | POA: Diagnosis not present

## 2020-04-05 NOTE — Progress Notes (Signed)
Male physical   Impression and Recommendations:    1. Healthcare maintenance   2. Screening for STD (sexually transmitted disease)   3. Impacted cerumen of right ear      1) Anticipatory Guidance: Skin CA prevention- recommend to continue to use sunscreen when outside along with skin surveillance; eating a balanced and modest diet; physical activity at least 25 minutes per day or minimum of 150 min/ week moderate to intense activity.  2) Immunizations / Screenings / Labs:   All immunizations are up-to-date per recommendations or will be updated today if pt allows.    - Patient understands with dental and vision screens they will schedule independently.  - Obtained CBC, CMP, HgA1c, Lipid panel, TSH and vit D when fasting. Most labs are essentially within normal limits. LDL mildly elevated.   3) Weight:  Recommend to improve diet habits to improve overall feelings of well being and objective health data. Improve nutrient density of diet through increasing intake of fruits and vegetables and decreasing saturated fats, white flour products and refined sugars.   4) Healthcare Maintenance: -Recommend to reduce red meat and follow a heart healthy diet. -Encourage to join the [facility] as planned and be more active. -Reduce/avoid tobacco/substance use. -Recommend to use OTC Debrox ear drops to remove cerumen. -Follow up in 1 year for CPE and FBW or sooner if needed.   Orders Placed This Encounter  Procedures  . GC/Chlamydia Probe Amp  . Tdap vaccine greater than or equal to 7yo IM    No orders of the defined types were placed in this encounter.    Return in about 1 year (around 04/05/2021) for CPE and FBW.   Gross side effects, risk and benefits, and alternatives of medications discussed with patient.  Patient is aware that all medications have potential side effects and we are unable to predict every side effect or drug-drug interaction that may occur.  Expresses verbal  understanding and consents to current therapy plan and treatment regimen.  Please see AVS handed out to patient at the end of our visit for further patient instructions/ counseling done pertaining to today's office visit.  Note:  This note was prepared with assistance of Dragon voice recognition software. Occasional wrong-word or sound-a-like substitutions may have occurred due to the inherent limitations of voice recognition software.      Subjective:        CC: CPE   HPI: Allen White is a 24 y.o. male who presents to [facility] today for a yearly health maintenance exam.     Health Maintenance Summary  - Reviewed and updated, unless pt declines services.   Tobacco History Reviewed:  Y, smokes about 2 cigars/day Alcohol / drug use:    No concerns, no excessive use/marijuana  Exercise Habits:  Plans to start being more active. Dental Home: Y  Eye exams:N  Male history: STD concerns:   none Additional penile/ urinary concerns: none   Additional concerns beyond Health Maintenance issues:  Palpitations have been less frequent, have improved.    There is no immunization history for the selected administration types on file for this patient.   Health Maintenance  Topic Date Due  . TETANUS/TDAP  Never done  . INFLUENZA VACCINE  07/26/2020 (Originally 11/27/2019)  . Hepatitis C Screening  04/05/2021 (Originally [birth year], 1996)  . HIV Screening  Completed       Wt Readings from Last 3 Encounters:  04/05/20 206 lb (93.4  kg)  01/24/20 205 lb 3.2 oz (93.1 kg)  03/28/18 179 lb 14.3 oz (81.6 kg)   BP Readings from Last 3 Encounters:  04/05/20 97/61  01/24/20 122/83  12/30/19 124/87   Pulse Readings from Last 3 Encounters:  04/05/20 74  01/24/20 71  12/30/19 85    There are no problems to display for this patient.   History reviewed. No pertinent past medical history.  History reviewed. No pertinent surgical history.  Family  History  Problem Relation Age of Onset  . Healthy Mother   . Healthy Father     Social History   Substance and Sexual Activity  Drug Use Yes  . Types: Marijuana  ,  Social History   Substance and Sexual Activity  Alcohol Use Yes  . Alcohol/week: 2.0 standard drinks  . Types: 2 Cans of beer per week  ,  Social History   Tobacco Use  Smoking Status Never Smoker  Smokeless Tobacco Never Used  ,  Social History   Substance and Sexual Activity  Sexual Activity Yes  . Partners: Female  . Birth control/protection: None    Patient's Medications   No medications on file    Patient has no known allergies.  Review of Systems: General:   Denies fever, chills, unexplained weight loss.  Optho/Auditory:   Denies visual changes, blurred vision/LOV Respiratory:   Denies SOB, DOE more than baseline levels.   Cardiovascular:   Denies chest pain, palpitations, new onset peripheral edema  Gastrointestinal:   Denies nausea, vomiting, diarrhea.  Genitourinary: Denies dysuria, freq/ urgency, flank pain or discharge from genitals.  Endocrine:     Denies hot or cold intolerance, polyuria, polydipsia. Musculoskeletal:   Denies unexplained myalgias, joint swelling, unexplained arthralgias, gait problems.  Skin:  Denies rash, suspicious lesions Neurological:     Denies dizziness, unexplained weakness, numbness  Psychiatric/Behavioral:   Denies mood changes, suicidal or homicidal ideations, hallucinations    Objective:     Blood pressure 97/61, pulse 74, temperature 98.7 F (37.1 C), temperature source Oral, height 5' 10.75" (1.797 m), weight 206 lb (93.4 kg), SpO2 98 %. Body mass index is 28.93 kg/m. General Appearance:    Alert, cooperative, no distress, appears stated age  Head:    Normocephalic, without obvious abnormality, atraumatic  Eyes:    PERRL, conjunctiva/corneas clear, EOM's intact, both eyes  Ears:    Normal TM's and external ear canals, both ears  Nose:   Nares  normal, septum midline, mucosa normal, no drainage    or sinus tenderness  Throat:   Lips w/o lesion, mucosa moist, and tongue normal; teeth and  gums normal  Neck:   Supple, symmetrical, trachea midline, no adenopathy;    thyroid:  no enlargement/tenderness/nodules  Back:     Symmetric, no curvature, ROM normal, no CVA tenderness  Lungs:     Clear to auscultation bilaterally, respirations unlabored, no  Wh/ R/ R  Chest Wall:    No tenderness or gross deformity; normal excursion   Heart:    Regular rate and rhythm, S1 and S2 normal, no murmur, rub   or gallop  Abdomen:     Soft, non-tender, bowel sounds active all four quadrants, No G/R/R, no masses, no organomegaly  Genitalia:   Deferred. No concerns/sxs.  Rectal:   Deferred. No concerns/sxs.  Extremities:   Extremities normal, atraumatic, no cyanosis or gross edema  Pulses:   2+ and symmetric all extremities  Skin:   Warm, dry, Skin color, texture, turgor normal,  no obvious rashes or lesions  M-Sk:   Ambulates * 4 w/o difficulty, no gross deformities, tone WNL  Neurologic:   CNII-XII intact, normal strength, sensation and reflexes    Throughout Psych:  No HI/SI, judgement and insight good, Euthymic mood. Full Affect.

## 2020-04-05 NOTE — Patient Instructions (Signed)
Preventive Care 24-24 Years Old, Male Preventive care refers to lifestyle choices and visits with your health care provider that can promote health and wellness. This includes:  A yearly physical exam. This is also called an annual well check.  Regular dental and eye exams.  Immunizations.  Screening for certain conditions.  Healthy lifestyle choices, such as eating a healthy diet, getting regular exercise, not using drugs or products that contain nicotine and tobacco, and limiting alcohol use. What can I expect for my preventive care visit? Physical exam Your health care provider will check:  Height and weight. These may be used to calculate body mass index (BMI), which is a measurement that tells if you are at a healthy weight.  Heart rate and blood pressure.  Your skin for abnormal spots. Counseling Your health care provider may ask you questions about:  Alcohol, tobacco, and drug use.  Emotional well-being.  Home and relationship well-being.  Sexual activity.  Eating habits.  Work and work Statistician. What immunizations do I need?  Influenza (flu) vaccine  This is recommended every year. Tetanus, diphtheria, and pertussis (Tdap) vaccine  You may need a Td booster every 24 years. Varicella (chickenpox) vaccine  You may need this vaccine if you have not already been vaccinated. Human papillomavirus (HPV) vaccine  If recommended by your health care provider, you may need three doses over 6 months. Measles, mumps, and rubella (MMR) vaccine  You may need at least one dose of MMR. You may also need a second dose. Meningococcal conjugate (MenACWY) vaccine  One dose is recommended if you are 45-76 years old and a Market researcher living in a residence hall, or if you have one of several medical conditions. You may also need additional booster doses. Pneumococcal conjugate (PCV13) vaccine  You may need this if you have certain conditions and were not  previously vaccinated. Pneumococcal polysaccharide (PPSV23) vaccine  You may need one or two doses if you smoke cigarettes or if you have certain conditions. Hepatitis A vaccine  You may need this if you have certain conditions or if you travel or work in places where you may be exposed to hepatitis A. Hepatitis B vaccine  You may need this if you have certain conditions or if you travel or work in places where you may be exposed to hepatitis B. Haemophilus influenzae type b (Hib) vaccine  You may need this if you have certain risk factors. You may receive vaccines as individual doses or as more than one vaccine together in one shot (combination vaccines). Talk with your health care provider about the risks and benefits of combination vaccines. What tests do I need? Blood tests  Lipid and cholesterol levels. These may be checked every 5 years starting at age 24.  Hepatitis C test.  Hepatitis B test. Screening   Diabetes screening. This is done by checking your blood sugar (glucose) after you have not eaten for a while (fasting).  Sexually transmitted disease (STD) testing. Talk with your health care provider about your test results, treatment options, and if necessary, the need for more tests. Follow these instructions at home: Eating and drinking   Eat a diet that includes fresh fruits and vegetables, whole grains, lean protein, and low-fat dairy products.  Take vitamin and mineral supplements as recommended by your health care provider.  Do not drink alcohol if your health care provider tells you not to drink.  If you drink alcohol: ? Limit how much you have to 0-2  drinks a day. ? Be aware of how much alcohol is in your drink. In the [place] one drink equals one 12 oz bottle of beer (355 mL), one 5 oz glass of wine (148 mL), or one 1 oz glass of hard liquor (44 mL). Lifestyle  Take daily care of your teeth and gums.  Stay active. Exercise for at least 30 minutes on 5 or  more days each week.  Do not use any products that contain nicotine or tobacco, such as cigarettes, e-cigarettes, and chewing tobacco. If you need help quitting, ask your health care provider.  If you are sexually active, practice safe sex. Use a condom or other form of protection to prevent STIs (sexually transmitted infections). What's next?  Go to your health care provider once a year for a well check visit.  Ask your health care provider how often you should have your eyes and teeth checked.  Stay up to date on all vaccines. This information is not intended to replace advice given to you by your health care provider. Make sure you discuss any questions you have with your health care provider. Document Revised: 04/08/2018 Document Reviewed: 04/08/2018 Elsevier Patient Education  2020 [employer].

## 2020-04-09 LAB — GC/CHLAMYDIA PROBE AMP
Chlamydia trachomatis, NAA: NEGATIVE
Neisseria Gonorrhoeae by PCR: NEGATIVE

## 2021-08-24 ENCOUNTER — Encounter: Payer: Self-pay | Admitting: Emergency Medicine

## 2021-08-24 ENCOUNTER — Ambulatory Visit
Admission: EM | Admit: 2021-08-24 | Discharge: 2021-08-24 | Disposition: A | Discharge status: Home / Self Care | Payer: BC Managed Care – PPO | Attending: Family Medicine | Admitting: Family Medicine

## 2021-08-24 DIAGNOSIS — S61431A Puncture wound without foreign body of right hand, initial encounter: Secondary | ICD-10-CM

## 2021-08-24 DIAGNOSIS — Z23 Encounter for immunization: Secondary | ICD-10-CM | POA: Diagnosis not present

## 2021-08-24 DIAGNOSIS — W540XXA Bitten by dog, initial encounter: Secondary | ICD-10-CM

## 2021-08-24 MED ORDER — IBUPROFEN 800 MG PO TABS: 800.0000 mg | ORAL_TABLET | Freq: Three times a day (TID) | ORAL | 0 refills | Status: AC | PRN

## 2021-08-24 MED ORDER — TETANUS-DIPHTH-ACELL PERTUSSIS 5-2.5-18.5 LF-MCG/0.5 IM SUSY
0.5000 mL | PREFILLED_SYRINGE | Freq: Once | INTRAMUSCULAR | Status: AC
  Administered 2021-08-24: 0.5 mL via INTRAMUSCULAR

## 2021-08-24 MED ORDER — AMOXICILLIN-POT CLAVULANATE 875-125 MG PO TABS: 1.0000 | ORAL_TABLET | Freq: Two times a day (BID) | ORAL | 0 refills | Status: AC

## 2021-08-24 NOTE — ED Triage Notes (Signed)
Patient c/o dog bite on right hand last night.  Right hand swelling, small puncture wound.  Patient unsure of last Tdap. ?

## 2021-08-24 NOTE — Discharge Instructions (Addendum)
Take amoxicillin-clavulanate 875 mg--1 tab twice daily with food for 7 days  ? ?Take ibuprofen 800 mg--1 tab every 8 hours as needed for pain.  ? ?Ice and elevate your hand. ?

## 2021-08-24 NOTE — ED Provider Notes (Addendum)
?[place] ? ? ? ?CSN: [id number] ?Arrival date & time: 08/24/21  1122 ? ? ?  ? ?History   ?Chief Complaint ?Chief Complaint  ?Patient presents with  ? Animal Bite  ? ? ?HPI ?Allen White is a 26 y.o. male.  ? ? ?Animal Bite ?Here for dog bite/puncture wound to right hand. His young 50 pound dog thought he was playing yesterday evening, and got hold of his hand with her mouth, and he sustained a puncture wound to the dorsum of the right hand. ? ?Has swollen some since it happened, and is hurting in the proximal hand some when he flexes or extends fingers. No dc so far. No f/c. ? ?Uncertain last tetanus ? ?The dog is utd on her immunizations ? ?History reviewed. No pertinent past medical history. ? ?There are no problems to display for this patient. ? ? ?History reviewed. No pertinent surgical history. ? ? ? ? ?Home Medications   ? ?Prior to Admission medications   ?Medication Sig Start Date End Date Taking? Authorizing Provider  ?amoxicillin-clavulanate (AUGMENTIN) 875-125 MG tablet Take 1 tablet by mouth 2 (two) times daily for 7 days. 08/24/21 08/31/21 Yes Barrett Henle, MD  ?ibuprofen (ADVIL) 800 MG tablet Take 1 tablet (800 mg total) by mouth every 8 (eight) hours as needed (pain). 08/24/21  Yes Barrett Henle, MD  ? ? ?Family History ?Family History  ?Problem Relation Age of Onset  ? Healthy Mother   ? Healthy Father   ? ? ?Social History ?Social History  ? ?Tobacco Use  ? Smoking status: Never  ? Smokeless tobacco: Never  ?Vaping Use  ? Vaping Use: Never used  ?Substance Use Topics  ? Alcohol use: Yes  ?  Alcohol/week: 2.0 standard drinks  ?  Types: 2 Cans of beer per week  ? Drug use: Yes  ?  Types: Marijuana  ? ? ? ?Allergies   ?Patient has no known allergies. ? ? ?Review of Systems ?Review of Systems ? ? ?Physical Exam ?Triage Vital Signs ?ED Triage Vitals  ?Enc Vitals Group  ?   BP 08/24/21 1137 116/73  ?   Pulse Rate 08/24/21 1137 68  ?   Resp 08/24/21 1137 18  ?   Temp 08/24/21 1137  97.9 ?F (36.6 ?C)  ?   Temp Source 08/24/21 1137 Oral  ?   SpO2 08/24/21 1137 97 %  ?   Weight 08/24/21 1140 215 lb (97.5 kg)  ?   Height 08/24/21 1140 6' (1.829 m)  ?   Head Circumference --   ?   Peak Flow --   ?   Pain Score 08/24/21 1140 8  ?   Pain Loc --   ?   Pain Edu? --   ?   Excl. in [place]? --   ? ?No data found. ? ?Updated Vital Signs ?BP 116/73 (BP Location: Left Arm)   Pulse 68   Temp 97.9 ?F (36.6 ?C) (Oral)   Resp 18   Ht 6' (1.829 m)   Wt 97.5 kg   SpO2 97%   BMI 29.16 kg/m?  ? ?Visual Acuity ?Right Eye Distance:   ?Left Eye Distance:   ?Bilateral Distance:   ? ?Right Eye Near:   ?Left Eye Near:    ?Bilateral Near:    ? ?Physical Exam ?Vitals reviewed.  ?Constitutional:   ?   General: He is not in acute distress. ?   Appearance: He is not ill-appearing, toxic-appearing or diaphoretic.  ?  Skin: ?   Comments: There is a puncture wound about 0.5 cm in length. Some edema around it, about 4 cm diameter. No warmth or erythema. Is tender. He is able to make a fist and is able to extend his fingers fully. Sensation intact distally.  ?Neurological:  ?   Mental Status: He is alert and oriented to person, place, and time.  ?Psychiatric:     ?   Behavior: Behavior normal.  ? ? ? ?UC Treatments / Results  ?Labs ?(all labs ordered are listed, but only abnormal results are displayed) ?Labs Reviewed - No data to display ? ?EKG ? ? ?Radiology ?No results found. ? ?Procedures ?Procedures (including critical care time) ? ?Medications Ordered in UC ?Medications  ?Tdap (BOOSTRIX) injection 0.5 mL (has no administration in time range)  ? ? ?Initial Impression / Assessment and Plan / UC Course  ?I have reviewed the triage vital signs and the nursing notes. ? ?Pertinent labs & imaging results that were available during my care of the patient were reviewed by me and considered in my medical decision making (see chart for details). ? ?  ? ?Will cover with augmentin. Given warnings for the ER--if worsening and ROM becoming  limited quickly. ? ?Tdap today  ?Final Clinical Impressions(s) / UC Diagnoses  ? ?Final diagnoses:  ?Dog bite, initial encounter  ? ? ? ?Discharge Instructions   ? ?  ?Take amoxicillin-clavulanate 875 mg--1 tab twice daily with food for 7 days  ? ?Take ibuprofen 800 mg--1 tab every 8 hours as needed for pain.  ? ?Ice and elevate your hand. ? ? ? ? ?ED Prescriptions   ? ? Medication Sig Dispense Auth. Provider  ? amoxicillin-clavulanate (AUGMENTIN) 875-125 MG tablet Take 1 tablet by mouth 2 (two) times daily for 7 days. 14 tablet Trevan Messman, Gwenlyn Perking, MD  ? ibuprofen (ADVIL) 800 MG tablet Take 1 tablet (800 mg total) by mouth every 8 (eight) hours as needed (pain). 21 tablet Windy Carina Gwenlyn Perking, MD  ? ?  ? ?PDMP not reviewed this encounter. ?  ?Barrett Henle, MD ?08/24/21 1159 ? ?  ?Barrett Henle, MD ?08/24/21 1200 ? ?

## 2021-10-30 ENCOUNTER — Ambulatory Visit
Admission: EM | Admit: 2021-10-30 | Discharge: 2021-10-30 | Disposition: A | Discharge status: Home / Self Care | Payer: BC Managed Care – PPO | Attending: Internal Medicine | Admitting: Internal Medicine

## 2021-10-30 DIAGNOSIS — S61432A Puncture wound without foreign body of left hand, initial encounter: Secondary | ICD-10-CM

## 2021-10-30 DIAGNOSIS — W540XXA Bitten by dog, initial encounter: Secondary | ICD-10-CM

## 2021-10-30 DIAGNOSIS — S61452A Open bite of left hand, initial encounter: Secondary | ICD-10-CM

## 2021-10-30 MED ORDER — AMOXICILLIN-POT CLAVULANATE 875-125 MG PO TABS: 1.0000 | ORAL_TABLET | Freq: Two times a day (BID) | ORAL | 0 refills | Status: DC

## 2021-10-30 NOTE — Discharge Instructions (Signed)
You have been prescribed antibiotic for dog bite.  Please monitor for signs of infection that include increased redness, swelling, pus.  Wash daily with Dial soap.  Please follow-up if symptoms persist or worsen.

## 2021-10-30 NOTE — ED Triage Notes (Signed)
Pt present animal bite on his left hand the incident occurred, pt states it is mom dog the bitten and is up to date on her  doggy vaccine.

## 2021-10-30 NOTE — ED Provider Notes (Signed)
[facility]    CSN: [id number] Arrival date & time: 10/30/21  0909      History   Chief Complaint Chief Complaint  Patient presents with   Animal Bite    HPI Allen White is a 26 y.o. male.   Patient presents with a dog bite to left hand that occurred an hour or two prior to arrival to urgent care today.  Patient states that it was his mother's dog and dog is up-to-date on rabies vaccination.  He was recently seen in April for dog bite by same dog.  He states that the dog was jumping on his girlfriend's car and he was trying to stop it when the dog bit his hand.  Denies any numbness or tingling and patient has full range of motion of hand.  He does not take any blood thinners.  His tetanus vaccine was updated in April with other dog bite.  He has not cleaned or washed wounds.   Animal Bite   History reviewed. No pertinent past medical history.  There are no problems to display for this patient.   History reviewed. No pertinent surgical history.     Home Medications    Prior to Admission medications   Medication Sig Start Date End Date Taking? Authorizing Provider  amoxicillin-clavulanate (AUGMENTIN) 875-125 MG tablet Take 1 tablet by mouth every 12 (twelve) hours. 10/30/21  Yes Chandi Nicklin, Rolly Salter E, FNP  ibuprofen (ADVIL) 800 MG tablet Take 1 tablet (800 mg total) by mouth every 8 (eight) hours as needed (pain). 08/24/21   Zenia Resides, MD    Family History Family History  Problem Relation Age of Onset   Healthy Mother    Healthy Father     Social History Social History   Tobacco Use   Smoking status: Never   Smokeless tobacco: Never  Vaping Use   Vaping Use: Never used  Substance Use Topics   Alcohol use: Yes    Alcohol/week: 2.0 standard drinks of alcohol    Types: 2 Cans of beer per week   Drug use: Yes    Types: Marijuana     Allergies   Patient has no known allergies.   Review of Systems Review of Systems Per HPI  Physical  Exam Triage Vital Signs ED Triage Vitals  Enc Vitals Group     BP 10/30/21 1010 129/77     Pulse Rate 10/30/21 1010 (!) 53     Resp 10/30/21 1010 18     Temp 10/30/21 1010 98.1 F (36.7 C)     Temp Source 10/30/21 1010 Oral     SpO2 10/30/21 1010 98 %     Weight --      Height --      Head Circumference --      Peak Flow --      Pain Score 10/30/21 1011 8     Pain Loc --      Pain Edu? --      Excl. in GC? --    No data found.  Updated Vital Signs BP 129/77 (BP Location: Left Arm)   Pulse (!) 53   Temp 98.1 F (36.7 C) (Oral)   Resp 18   SpO2 98%   Visual Acuity Right Eye Distance:   Left Eye Distance:   Bilateral Distance:    Right Eye Near:   Left Eye Near:    Bilateral Near:     Physical Exam Constitutional:      General:  He is not in acute distress.    Appearance: Normal appearance. He is not toxic-appearing or diaphoretic.  HENT:     Head: Normocephalic and atraumatic.  Eyes:     Extraocular Movements: Extraocular movements intact.     Conjunctiva/sclera: Conjunctivae normal.  Pulmonary:     Effort: Pulmonary effort is normal.  Skin:    Comments: There are 3 puncture wounds present.  First puncture redness present on the dorsal surface of the hand overlying 2nd through 3rd metacarpal.  Bleeding is controlled.  The other 2 puncture wounds are present on the palmar surface of the about 1 to 1.5 inches apart.  Bleeding is controlled.  No purulent drainage.  Puncture wounds appear very superficial.  Patient has full range of motion of hand and fingers.  Capillary refill and pulses intact.  Grip strength 5/5.  Neurological:     General: No focal deficit present.     Mental Status: He is alert and oriented to person, place, and time. Mental status is at baseline.  Psychiatric:        Mood and Affect: Mood normal.        Behavior: Behavior normal.        Thought Content: Thought content normal.        Judgment: Judgment normal.      UC Treatments /  Results  Labs (all labs ordered are listed, but only abnormal results are displayed) Labs Reviewed - No data to display  EKG   Radiology No results found.  Procedures Procedures (including critical care time)  Medications Ordered in UC Medications - No data to display  Initial Impression / Assessment and Plan / UC Course  I have reviewed the triage vital signs and the nursing notes.  Pertinent labs & imaging results that were available during my care of the patient were reviewed by me and considered in my medical decision making (see chart for details).     Wounds were cleaned and antibiotic ointment applied, nonadherent dressing applied.  Patient advised of wound care.  No need for tetanus vaccine as it was updated a few months prior.  Will treat with Augmentin antibiotic for prophylaxis for dog bite.  Patient to monitor for signs of infection and follow-up if it occurs.  Patient was given strict return precautions.  Patient verbalized understanding and was agreeable with plan. Final Clinical Impressions(s) / UC Diagnoses   Final diagnoses:  Dog bite, hand, left, initial encounter  Puncture wound of left hand without foreign body, initial encounter     Discharge Instructions      You have been prescribed antibiotic for dog bite.  Please monitor for signs of infection that include increased redness, swelling, pus.  Wash daily with Dial soap.  Please follow-up if symptoms persist or worsen.    ED Prescriptions     Medication Sig Dispense Auth. Provider   amoxicillin-clavulanate (AUGMENTIN) 875-125 MG tablet Take 1 tablet by mouth every 12 (twelve) hours. 14 tablet [place], Acie Fredrickson, [place]      PDMP not reviewed this encounter.   Gustavus Bryant, [place] 10/30/21 1043

## 2023-10-08 ENCOUNTER — Ambulatory Visit
Admission: EM | Admit: 2023-10-08 | Discharge: 2023-10-08 | Disposition: A | Discharge status: Home / Self Care | Payer: Self-pay

## 2023-10-08 ENCOUNTER — Encounter: Payer: Self-pay | Admitting: Emergency Medicine

## 2023-10-08 DIAGNOSIS — J101 Influenza due to other identified influenza virus with other respiratory manifestations: Secondary | ICD-10-CM

## 2023-10-08 LAB — POCT INFLUENZA A/B
Influenza A, POC: POSITIVE — AB
Influenza B, POC: NEGATIVE

## 2023-10-08 LAB — POC SARS CORONAVIRUS 2 AG -  ED: SARS Coronavirus 2 Ag: NEGATIVE

## 2023-10-08 MED ORDER — AZELASTINE HCL 0.1 % NA SOLN: 1.0000 | Freq: Two times a day (BID) | NASAL | 1 refills | Status: AC

## 2023-10-08 MED ORDER — OSELTAMIVIR PHOSPHATE 75 MG PO CAPS: 75.0000 mg | ORAL_CAPSULE | Freq: Two times a day (BID) | ORAL | 0 refills | Status: AC

## 2023-10-08 NOTE — Discharge Instructions (Addendum)
  1. Influenza A (Primary) - POC SARS Coronavirus 2 Ag-ED - Nasal Swab completed in UC is negative for COVID-19 - POCT Influenza A/B completed in UC is positive for influenza A, negative for influenza B - azelastine (ASTELIN) 0.1 % nasal spray; Place 1 spray into both nostrils 2 (two) times daily. Use in each nostril as directed  Dispense: 30 mL; Refill: 1 - oseltamivir (TAMIFLU) 75 MG capsule; Take 1 capsule (75 mg total) by mouth every 12 (twelve) hours.  Dispense: 10 capsule; Refill: 0 - Take daily antihistamine either Claritin, Zyrtec, Allegra for nasal congestion and postnasal drip symptoms. - Drink plenty of fluids, get plenty of rest, take extra vitamin C and eat a healthy diet to help with body fight viral illness. - Follow-up for further evaluation if you develop fever, severe fatigue, chest pain, shortness of breath, wheezing, weakness, dizziness or severe intractable headache. -Continue to monitor symptoms for any change in severity if there is any escalation of current symptoms or development of new symptoms follow-up in ER for further evaluation and management.

## 2023-10-08 NOTE — ED Provider Notes (Signed)
 [facility]  Note:  This document was prepared using Conservation officer, historic buildings and may include unintentional dictation errors.  MRN: [id number] DOB: [birth year]  Subjective:   Allen White is a 28 y.o. male presenting for nasal congestion, dry cough, fever, body aches x 2 days.  Patient reports that he is taken ibuprofen  for fever and bodyaches with mild improvement to symptoms.  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  Patient is not taking any other over-the-counter medication to treat symptoms.  No shortness of breath, chest pain, weakness, dizziness.  No current facility-administered medications for this encounter.  Current Outpatient Medications:    azelastine (ASTELIN) 0.1 % nasal spray, Place 1 spray into both nostrils 2 (two) times daily. Use in each nostril as directed, Disp: 30 mL, Rfl: 1   ibuprofen  (ADVIL ) 800 MG tablet, Take 1 tablet (800 mg total) by mouth every 8 (eight) hours as needed (pain)., Disp: 21 tablet, Rfl: 0   oseltamivir (TAMIFLU) 75 MG capsule, Take 1 capsule (75 mg total) by mouth every 12 (twelve) hours., Disp: 10 capsule, Rfl: 0   No Known Allergies  History reviewed. No pertinent past medical history.   History reviewed. No pertinent surgical history.  Family History  Problem Relation Age of Onset   Healthy Mother    Healthy Father     Social History   Tobacco Use   Smoking status: Never   Smokeless tobacco: Never  Vaping Use   Vaping status: Never Used  Substance Use Topics   Alcohol use: Yes    Alcohol/week: 2.0 standard drinks of alcohol    Types: 2 Cans of beer per week   Drug use: Yes    Types: Marijuana    ROS Refer to HPI for ROS details.  Objective:   Vitals: BP 123/86 (BP Location: Left Arm)   Pulse 68   Temp 98.1 F (36.7 C) (Oral)   Resp 16   SpO2 97%   Physical  Exam Vitals and nursing note reviewed.  Constitutional:      General: He is not in acute distress.    Appearance: Normal appearance. He is well-developed. He is not ill-appearing or toxic-appearing.  HENT:     Head: Normocephalic.     Nose: Congestion present. No rhinorrhea.     Mouth/Throat:     Mouth: Mucous membranes are moist.     Pharynx: Oropharynx is clear. Posterior oropharyngeal erythema present. No oropharyngeal exudate.   Cardiovascular:     Rate and Rhythm: Normal rate and regular rhythm.     Heart sounds: Normal heart sounds. No murmur heard. Pulmonary:     Effort: Pulmonary effort is normal. No respiratory distress.     Breath sounds: Normal breath sounds. No stridor. No wheezing, rhonchi or rales.  Chest:     Chest wall: No tenderness.   Skin:    General: Skin is warm and dry.   Neurological:     General: No focal deficit present.     Mental Status: He is alert and oriented to person, place, and time.   Psychiatric:        Mood and Affect: Mood normal.        Behavior: Behavior normal.     Procedures  Results for orders placed or performed during the hospital encounter of 10/08/23 (from the past 24 hours)  POC  SARS Coronavirus 2 Ag-ED - Nasal Swab     Status: Normal   Collection Time: 10/08/23  8:43 AM  Result Value Ref Range   SARS Coronavirus 2 Ag Negative Negative  POCT Influenza A/B     Status: Abnormal   Collection Time: 10/08/23  8:43 AM  Result Value Ref Range   Influenza A, POC Positive (A) Negative   Influenza B, POC Negative Negative    No results found.   Assessment and Plan :     Discharge Instructions       1. Influenza A (Primary) - POC SARS Coronavirus 2 Ag-ED - Nasal Swab completed in UC is negative for COVID-19 - POCT Influenza A/B completed in UC is positive for influenza A, negative for influenza B - azelastine (ASTELIN) 0.1 % nasal spray; Place 1 spray into both nostrils 2 (two) times daily. Use in each nostril as  directed  Dispense: 30 mL; Refill: 1 - oseltamivir (TAMIFLU) 75 MG capsule; Take 1 capsule (75 mg total) by mouth every 12 (twelve) hours.  Dispense: 10 capsule; Refill: 0 - Take daily antihistamine either Claritin, Zyrtec, Allegra for nasal congestion and postnasal drip symptoms. - Drink plenty of fluids, get plenty of rest, take extra vitamin C and eat a healthy diet to help with body fight viral illness. - Follow-up for further evaluation if you develop fever, severe fatigue, chest pain, shortness of breath, wheezing, weakness, dizziness or severe intractable headache. -Continue to monitor symptoms for any change in severity if there is any escalation of current symptoms or development of new symptoms follow-up in ER for further evaluation and management.       Adem Costlow B Haldon Carley   Nakeshia Waldeck, [place], [place] 10/08/23 [phone]

## 2023-10-08 NOTE — ED Triage Notes (Signed)
 Pt reports nasal congestion, dry cough, and fever x2 days. Last fever was yesterday (101.1). No sick contacts. No med use for symptoms.
